# Patient Record
Sex: Female | Born: 2000 | Race: Black or African American | Hispanic: No | Marital: Married | Smoking: Current every day smoker
Health system: Southern US, Community
[De-identification: ages and names within clinical notes are randomized; demographics above are authoritative.]

## PROBLEM LIST (undated history)

## (undated) DIAGNOSIS — F909 Attention-deficit hyperactivity disorder, unspecified type: Secondary | ICD-10-CM

---
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
## 2018-01-10 ENCOUNTER — Emergency Department (HOSPITAL_COMMUNITY)
Admission: EM | Admit: 2018-01-10 | Discharge: 2018-01-10 | Disposition: A | Discharge status: Home / Self Care | Payer: Medicaid Other | Attending: Emergency Medicine | Admitting: Emergency Medicine

## 2018-01-10 DIAGNOSIS — F17210 Nicotine dependence, cigarettes, uncomplicated: Secondary | ICD-10-CM | POA: Diagnosis not present

## 2018-01-10 DIAGNOSIS — Z79899 Other long term (current) drug therapy: Secondary | ICD-10-CM | POA: Diagnosis not present

## 2018-01-10 DIAGNOSIS — N3001 Acute cystitis with hematuria: Secondary | ICD-10-CM | POA: Diagnosis not present

## 2018-01-10 DIAGNOSIS — F909 Attention-deficit hyperactivity disorder, unspecified type: Secondary | ICD-10-CM | POA: Insufficient documentation

## 2018-01-10 DIAGNOSIS — R202 Paresthesia of skin: Secondary | ICD-10-CM | POA: Diagnosis present

## 2018-01-10 LAB — URINALYSIS, ROUTINE W REFLEX MICROSCOPIC
BILIRUBIN URINE: NEGATIVE
Glucose, UA: NEGATIVE mg/dL
Ketones, ur: NEGATIVE mg/dL
Nitrite: NEGATIVE
PH: 5 (ref 5.0–8.0)
Protein, ur: 100 mg/dL — AB
SPECIFIC GRAVITY, URINE: 1.025 (ref 1.005–1.030)
WBC, UA: >50 WBC/hpf — ABNORMAL HIGH (ref 0–5)

## 2018-01-10 LAB — POC URINE PREG, ED: Preg Test, Ur: NEGATIVE

## 2018-01-10 MED ORDER — CEPHALEXIN 500 MG PO CAPS: 500.0000 mg | ORAL_CAPSULE | Freq: Two times a day (BID) | ORAL | 0 refills | Status: DC

## 2018-01-10 NOTE — Discharge Instructions (Addendum)
Please read attached information. If you experience any new or worsening signs or symptoms please return to the emergency room for evaluation. Please follow-up with your primary care provider or specialist as discussed. Please use medication prescribed only as directed and discontinue taking if you have any concerning signs or symptoms.   °

## 2018-01-10 NOTE — ED Notes (Signed)
Pt discharged from ED; instructions provided and scripts given; Pt encouraged to return to ED if symptoms worsen and to f/u with PCP; Pt verbalized understanding of all instructions 

## 2018-01-10 NOTE — ED Triage Notes (Signed)
Pt here with c/o urinary frequency and some slight burning on urination , no bleeding or discharge noted

## 2018-01-10 NOTE — ED Provider Notes (Signed)
[facility] EMERGENCY DEPARTMENT Provider Note   CSN: [id number] Arrival date & time: 01/10/18  0856     History   Chief Complaint Chief Complaint  Patient presents with  . Urinary Tract Infection    HPI Shannon Bruce is a 18 y.o. female.  HPI   18 year old female presents today with urinary complaints.  Patient notes her last 3 days she has had tingling when she urinates in her bladder.  She denies any significant pain, bleeding, change in color or odor.  She denies any nausea vomiting fever back pain or upper abdominal pain.  She notes she is currently on her menstrual cycle, no vaginal discharge noted.  She notes this feels similar to previous urinary tract infection.    Past Medical History:  Diagnosis Date  . ADHD (attention deficit hyperactivity disorder)     Patient Active Problem List   Diagnosis Date Noted  . MDD (major depressive disorder), recurrent, severe, with psychosis (HCC) 01/16/2014  . Attention deficit hyperactivity disorder (ADHD), combined type, moderate 01/16/2014  . ODD (oppositional defiant disorder) 01/16/2014    No past surgical history on file.   OB History   No obstetric history on file.      Home Medications    Prior to Admission medications   Medication Sig Start Date End Date Taking? Authorizing Provider  brompheniramine-pseudoephedrine-DM 30-2-10 MG/5ML syrup Take 5 mLs by mouth 4 (four) times daily as needed. 09/15/17   Evon Slack, PA-C  cephALEXin (KEFLEX) 500 MG capsule Take 1 capsule (500 mg total) by mouth 2 (two) times daily. 01/10/18   Keyron Pokorski, Tinnie Gens, PA-C  ciprofloxacin (CIPRO) 500 MG tablet Take 1 tablet (500 mg total) by mouth every 12 (twelve) hours. 02/18/17   Jimi Schappert, Tinnie Gens, PA-C  famotidine (PEPCID) 20 MG tablet Take 1 tablet (20 mg total) by mouth 2 (two) times daily. For 3 days then as needed thereafter for heartburn 04/08/15   Ree Shay, MD  ibuprofen (ADVIL,MOTRIN) 600 MG tablet Take 1 tablet  (600 mg total) by mouth every 8 (eight) hours as needed for mild pain. 01/21/14   Chauncey Mann, MD  methylphenidate 36 MG PO CR tablet Take 1 tablet (36 mg total) by mouth daily. 01/21/14   Chauncey Mann, MD  naproxen (NAPROSYN) 500 MG tablet Take 1 tablet (500 mg total) by mouth 2 (two) times daily with a meal. 06/02/17   Muthersbaugh, Dahlia Client, PA-C  ondansetron (ZOFRAN ODT) 4 MG disintegrating tablet 4mg  ODT q4 hours prn nausea/vomit 06/02/17   Muthersbaugh, Dahlia Client, PA-C  ondansetron (ZOFRAN) 4 MG tablet Take 1 tablet (4 mg total) by mouth every 8 (eight) hours as needed for nausea or vomiting. 02/20/17   Maczis, Elmer Sow, PA-C  phenazopyridine (PYRIDIUM) 200 MG tablet Take 1 tablet (200 mg total) by mouth 3 (three) times daily. 02/20/17   Maczis, Elmer Sow, PA-C  PRESCRIPTION MEDICATION Inject 1 application into the skin. Birth control implanted in arm summer 2015    [provider]  sulfamethoxazole-trimethoprim (BACTRIM DS,SEPTRA DS) 800-160 MG tablet Take 1 tablet by mouth 2 (two) times daily. 02/20/17   Maczis, Elmer Sow, PA-C  ziprasidone (GEODON) 60 MG capsule Take 1 capsule (60 mg total) by mouth daily at 6 PM. 01/21/14   Chauncey Mann, MD    Family History No family history on file.  Social History Social History   Tobacco Use  . Smoking status: Current Every Day Smoker    Packs/day: 0.25    Types:  Cigarettes  . Smokeless tobacco: Current User  Substance Use Topics  . Alcohol use: No  . Drug use: Yes    Types: Marijuana     Allergies   Patient has no known allergies.   Review of Systems Review of Systems  All other systems reviewed and are negative.    Physical Exam Updated Vital Signs BP 129/75 (BP Location: Right Arm)   Pulse 76   Temp 98.4 F (36.9 C) (Oral)   Resp 20   SpO2 97%   Physical Exam Vitals signs and nursing note reviewed.  Constitutional:      Appearance: She is well-developed.  HENT:     Head: Normocephalic and atraumatic.    Eyes:     General: No scleral icterus.       Right eye: No discharge.        Left eye: No discharge.     Conjunctiva/sclera: Conjunctivae normal.     Pupils: Pupils are equal, round, and reactive to light.  Neck:     Musculoskeletal: Normal range of motion.     Vascular: No JVD.     Trachea: No tracheal deviation.  Pulmonary:     Effort: Pulmonary effort is normal.     Breath sounds: No stridor.  Abdominal:     Comments: Abdomen soft nontender  Neurological:     Mental Status: She is alert and oriented to person, place, and time.     Coordination: Coordination normal.  Psychiatric:        Behavior: Behavior normal.        Thought Content: Thought content normal.        Judgment: Judgment normal.      ED Treatments / Results  Labs (all labs ordered are listed, but only abnormal results are displayed) Labs Reviewed  URINALYSIS, ROUTINE W REFLEX MICROSCOPIC - Abnormal; Notable for the following components:      Result Value   APPearance CLOUDY (*)    Hgb urine dipstick LARGE (*)    Protein, ur 100 (*)    Leukocytes, UA LARGE (*)    RBC / HPF >50 (*)    WBC, UA >50 (*)    Bacteria, UA RARE (*)    All other components within normal limits  POC URINE PREG, ED    EKG None  Radiology No results found.  Procedures Procedures (including critical care time)  Medications Ordered in ED Medications - No data to display   Initial Impression / Assessment and Plan / ED Course  I have reviewed the triage vital signs and the nursing notes.  Pertinent labs & imaging results that were available during my care of the patient were reviewed by me and considered in my medical decision making (see chart for details).     Labs: UA  Imaging:  Consults:  Therapeutics:  Discharge Meds: Keflex  Assessment/Plan: 49 female presents today with uncomplicated urinary tract infection.  She will be treated with Keflex, strict return precautions given, she verbalized understanding  and agreement to today's plan.   Final Clinical Impressions(s) / ED Diagnoses   Final diagnoses:  Acute cystitis with hematuria    ED Discharge Orders         Ordered    cephALEXin (KEFLEX) 500 MG capsule  2 times daily     01/10/18 1035           Eyvonne Mechanic, PA-C 01/10/18 1435    Mancel Bale, MD 01/10/18 [phone]

## 2019-04-25 ENCOUNTER — Emergency Department (HOSPITAL_COMMUNITY)
Admission: EM | Admit: 2019-04-25 | Discharge: 2019-04-25 | Disposition: A | Discharge status: Home / Self Care | Payer: Medicaid Other | Source: Home / Self Care | Attending: Emergency Medicine | Admitting: Emergency Medicine

## 2019-04-25 ENCOUNTER — Encounter (HOSPITAL_COMMUNITY): Payer: Self-pay

## 2019-04-25 ENCOUNTER — Other Ambulatory Visit: Payer: Self-pay

## 2019-04-25 ENCOUNTER — Emergency Department (HOSPITAL_COMMUNITY)
Admission: EM | Admit: 2019-04-25 | Discharge: 2019-04-25 | Disposition: A | Discharge status: Home / Self Care | Payer: Medicaid Other | Attending: Emergency Medicine | Admitting: Emergency Medicine

## 2019-04-25 ENCOUNTER — Emergency Department (HOSPITAL_COMMUNITY): Payer: Medicaid Other

## 2019-04-25 DIAGNOSIS — X58XXXA Exposure to other specified factors, initial encounter: Secondary | ICD-10-CM | POA: Diagnosis not present

## 2019-04-25 DIAGNOSIS — S8392XA Sprain of unspecified site of left knee, initial encounter: Secondary | ICD-10-CM | POA: Insufficient documentation

## 2019-04-25 DIAGNOSIS — Y929 Unspecified place or not applicable: Secondary | ICD-10-CM | POA: Diagnosis not present

## 2019-04-25 DIAGNOSIS — F909 Attention-deficit hyperactivity disorder, unspecified type: Secondary | ICD-10-CM | POA: Diagnosis not present

## 2019-04-25 DIAGNOSIS — Y9339 Activity, other involving climbing, rappelling and jumping off: Secondary | ICD-10-CM | POA: Diagnosis not present

## 2019-04-25 DIAGNOSIS — Y999 Unspecified external cause status: Secondary | ICD-10-CM | POA: Insufficient documentation

## 2019-04-25 DIAGNOSIS — S8992XA Unspecified injury of left lower leg, initial encounter: Secondary | ICD-10-CM | POA: Diagnosis present

## 2019-04-25 DIAGNOSIS — F17210 Nicotine dependence, cigarettes, uncomplicated: Secondary | ICD-10-CM | POA: Insufficient documentation

## 2019-04-25 MED ORDER — NAPROXEN 500 MG PO TABS: 500.0000 mg | ORAL_TABLET | Freq: Two times a day (BID) | ORAL | 0 refills | Status: DC

## 2019-04-25 NOTE — ED Triage Notes (Signed)
Pt reports that she was at work and jumped down and hurt her L knee.

## 2019-04-25 NOTE — Discharge Instructions (Signed)
You are seen today for left knee pain.  Your x-ray was normal.  However, we suspect that you may have a soft tissue injury or meniscus injury of your knee.  Please use the Ace wrap for stability and support.  Use crutches.  Avoid positions and activities that place excessive pressure on the knee joint until pain and swelling resolve. Such activities include: squatting, kneeling, twisting and pivoting, repetitive bending (eg, stairs, getting out of a seated position, clutch and pedal pushing), jogging, dancing, and swimming using the frog or whip kick.  Apply ice to the knee for 15 minutes every four to six hours, while keeping the leg elevated.

## 2019-04-25 NOTE — ED Provider Notes (Signed)
[facility] EMERGENCY DEPARTMENT Provider Note   CSN: [id number] Arrival date & time: 04/25/19  1335     History Chief Complaint  Patient presents with  . Leg Pain    Shannon Bruce is a 19 y.o. female.  Patient is a 19 year old female with past medical history of ADHD presenting to the emergency department for left knee pain.  Patient reports that yesterday she was standing at work on a surface that was about 1 foot high.  She jumped off the surface and landed correctly.  However, about 30 seconds later she began to feel a lateral left knee pain.  She reports clicking in the knee and feeling unstable in the knee to the point where she feels like she is going to fall when she walks.  No treatment prior to arrival.  Reports in the past she has had some issues with this knee as well but has never seen an orthopedic specialist before.        Past Medical History:  Diagnosis Date  . ADHD (attention deficit hyperactivity disorder)     Patient Active Problem List   Diagnosis Date Noted  . MDD (major depressive disorder), recurrent, severe, with psychosis ([place]) 01/16/2014  . Attention deficit hyperactivity disorder (ADHD), combined type, moderate 01/16/2014  . ODD (oppositional defiant disorder) 01/16/2014    History reviewed. No pertinent surgical history.   OB History   No obstetric history on file.     No family history on file.  Social History   Tobacco Use  . Smoking status: Current Every Day Smoker    Packs/day: 0.25    Types: Cigarettes  . Smokeless tobacco: Current User  Substance Use Topics  . Alcohol use: No  . Drug use: Yes    Types: Marijuana    Home Medications Prior to Admission medications   Medication Sig Start Date End Date Taking? Authorizing Provider  Naproxen Sodium (ALEVE PO) Take 1 tablet by mouth daily as needed (for pain).   Yes [provider]  brompheniramine-pseudoephedrine-DM 30-2-10 MG/5ML syrup Take 5 mLs by  mouth 4 (four) times daily as needed. Patient not taking: Reported on 04/25/2019 09/15/17   Duanne Guess, PA-C  cephALEXin (KEFLEX) 500 MG capsule Take 1 capsule (500 mg total) by mouth 2 (two) times daily. Patient not taking: Reported on 04/25/2019 01/10/18   Hedges, Dellis Filbert, PA-C  ciprofloxacin (CIPRO) 500 MG tablet Take 1 tablet (500 mg total) by mouth every 12 (twelve) hours. Patient not taking: Reported on 04/25/2019 02/18/17   Hedges, Dellis Filbert, PA-C  famotidine (PEPCID) 20 MG tablet Take 1 tablet (20 mg total) by mouth 2 (two) times daily. For 3 days then as needed thereafter for heartburn Patient not taking: Reported on 04/25/2019 04/08/15   Harlene Salts, MD  methylphenidate 36 MG PO CR tablet Take 1 tablet (36 mg total) by mouth daily. Patient not taking: Reported on 04/25/2019 01/21/14   Delight Hoh, MD  naproxen (NAPROSYN) 500 MG tablet Take 1 tablet (500 mg total) by mouth 2 (two) times daily with a meal. 04/25/19   Madilyn Hook A, PA-C  ondansetron (ZOFRAN ODT) 4 MG disintegrating tablet 4mg  ODT q4 hours prn nausea/vomit Patient not taking: Reported on 04/25/2019 06/02/17   Muthersbaugh, Jarrett Soho, PA-C  ondansetron (ZOFRAN) 4 MG tablet Take 1 tablet (4 mg total) by mouth every 8 (eight) hours as needed for nausea or vomiting. Patient not taking: Reported on 04/25/2019 02/20/17   Jillyn Ledger, PA-C  phenazopyridine (PYRIDIUM)  200 MG tablet Take 1 tablet (200 mg total) by mouth 3 (three) times daily. Patient not taking: Reported on 04/25/2019 02/20/17   Jacinto Halim, PA-C  sulfamethoxazole-trimethoprim (BACTRIM DS,SEPTRA DS) 800-160 MG tablet Take 1 tablet by mouth 2 (two) times daily. Patient not taking: Reported on 04/25/2019 02/20/17   Jacinto Halim, PA-C  ziprasidone (GEODON) 60 MG capsule Take 1 capsule (60 mg total) by mouth daily at 6 PM. Patient not taking: Reported on 04/25/2019 01/21/14   Chauncey Mann, MD    Allergies    Patient has no known allergies.  Review of  Systems   Review of Systems  Musculoskeletal: Positive for arthralgias and gait problem. Negative for back pain, joint swelling, myalgias, neck pain and neck stiffness.  Skin: Negative for pallor, rash and wound.  Neurological: Negative for numbness.    Physical Exam Updated Vital Signs BP 111/70 (BP Location: Left Arm)   Pulse 67   Temp 98.5 F (36.9 C) (Oral)   Resp 18   Ht 5\' 3"  (1.6 m)   Wt 91.2 kg   SpO2 100%   BMI 35.61 kg/m   Physical Exam Vitals and nursing note reviewed.  Constitutional:      Appearance: Normal appearance.  HENT:     Head: Normocephalic.  Eyes:     Conjunctiva/sclera: Conjunctivae normal.  Pulmonary:     Effort: Pulmonary effort is normal.  Musculoskeletal:     Left knee: No swelling, deformity, effusion, erythema, ecchymosis, lacerations or bony tenderness. Normal range of motion. Tenderness (Lateral joint line tenderness) present. No patellar tendon tenderness.     Instability Tests: Positive lateral McMurray test.  Skin:    General: Skin is dry.     Capillary Refill: Capillary refill takes less than 2 seconds.  Neurological:     Mental Status: She is alert.     Sensory: No sensory deficit.  Psychiatric:        Mood and Affect: Mood normal.     ED Results / Procedures / Treatments   Labs (all labs ordered are listed, but only abnormal results are displayed) Labs Reviewed - No data to display  EKG None  Radiology DG Knee Complete 4 Views Left  Result Date: 04/25/2019 CLINICAL DATA:  Left knee injury, pain EXAM: LEFT KNEE - COMPLETE 4+ VIEW COMPARISON:  06/01/2017 FINDINGS: Frontal, bilateral oblique, lateral views of the left knee are obtained. No fracture, subluxation, or dislocation. Joint spaces are well preserved. No joint effusion. Soft tissues are unremarkable. IMPRESSION: 1. Stable unremarkable left knee. Electronically Signed   By: 06/03/2017 M.D.   On: 04/25/2019 02:53    Procedures Procedures (including critical care  time)  Medications Ordered in ED Medications - No data to display  ED Course  I have reviewed the triage vital signs and the nursing notes.  Pertinent labs & imaging results that were available during my care of the patient were reviewed by me and considered in my medical decision making (see chart for details).  Clinical Course as of Apr 24 1628  Thu Apr 25, 2019  1606 Patient with lateral knee pain after jumping off of it.  History of the same in the past.  On my exam her knee appears normal.  She does have some lateral knee joint tenderness.  She had a positive click with a lateral McMurray test.  However, there is not significant instability, pain or swelling.  We will place her in an Ace wrap, crutches, NSAIDs and  sit down only duty at work.  Advised to follow-up with orthopedics in 1 to 2 weeks.   [KM]    Clinical Course User Index [KM] Jeral Pinch   MDM Rules/Calculators/A&P                      Based on review of vitals, medical screening exam, lab work and/or imaging, there does not appear to be an acute, emergent etiology for the patient's symptoms. Counseled pt on good return precautions and encouraged both PCP and ED follow-up as needed.  Prior to discharge, I also discussed incidental imaging findings with patient in detail and advised appropriate, recommended follow-up in detail.  Clinical Impression: 1. Sprain of left knee, unspecified ligament, initial encounter     Disposition: Discharge  Prior to providing a prescription for a controlled substance, I independently reviewed the patient's recent prescription history on the [place] Controlled Substance Reporting System. The patient had no recent or regular prescriptions and was deemed appropriate for a brief, less than 3 day prescription of narcotic for acute analgesia.  This note was prepared with assistance of Conservation officer, historic buildings. Occasional wrong-word or sound-a-like substitutions may  have occurred due to the inherent limitations of voice recognition software.  Final Clinical Impression(s) / ED Diagnoses Final diagnoses:  Sprain of left knee, unspecified ligament, initial encounter    Rx / DC Orders ED Discharge Orders         Ordered    naproxen (NAPROSYN) 500 MG tablet  2 times daily with meals     04/25/19 1630           Jeral Pinch 04/25/19 1630    Terrilee Files, MD 04/26/19 1031

## 2019-04-25 NOTE — ED Notes (Signed)
Patient verbalizes understanding of discharge instructions. Opportunity for questioning and answers were provided. Armband removed by staff, pt discharged from ED.  

## 2019-06-08 IMAGING — DX DG KNEE COMPLETE 4+V*L*
4 series · 4 of 4 positions shown · non-contrast
Comparison: 06/26/2012

CLINICAL DATA: Left knee pain for 2 years

EXAM:
LEFT KNEE - COMPLETE 4+ VIEW

[knee ap]
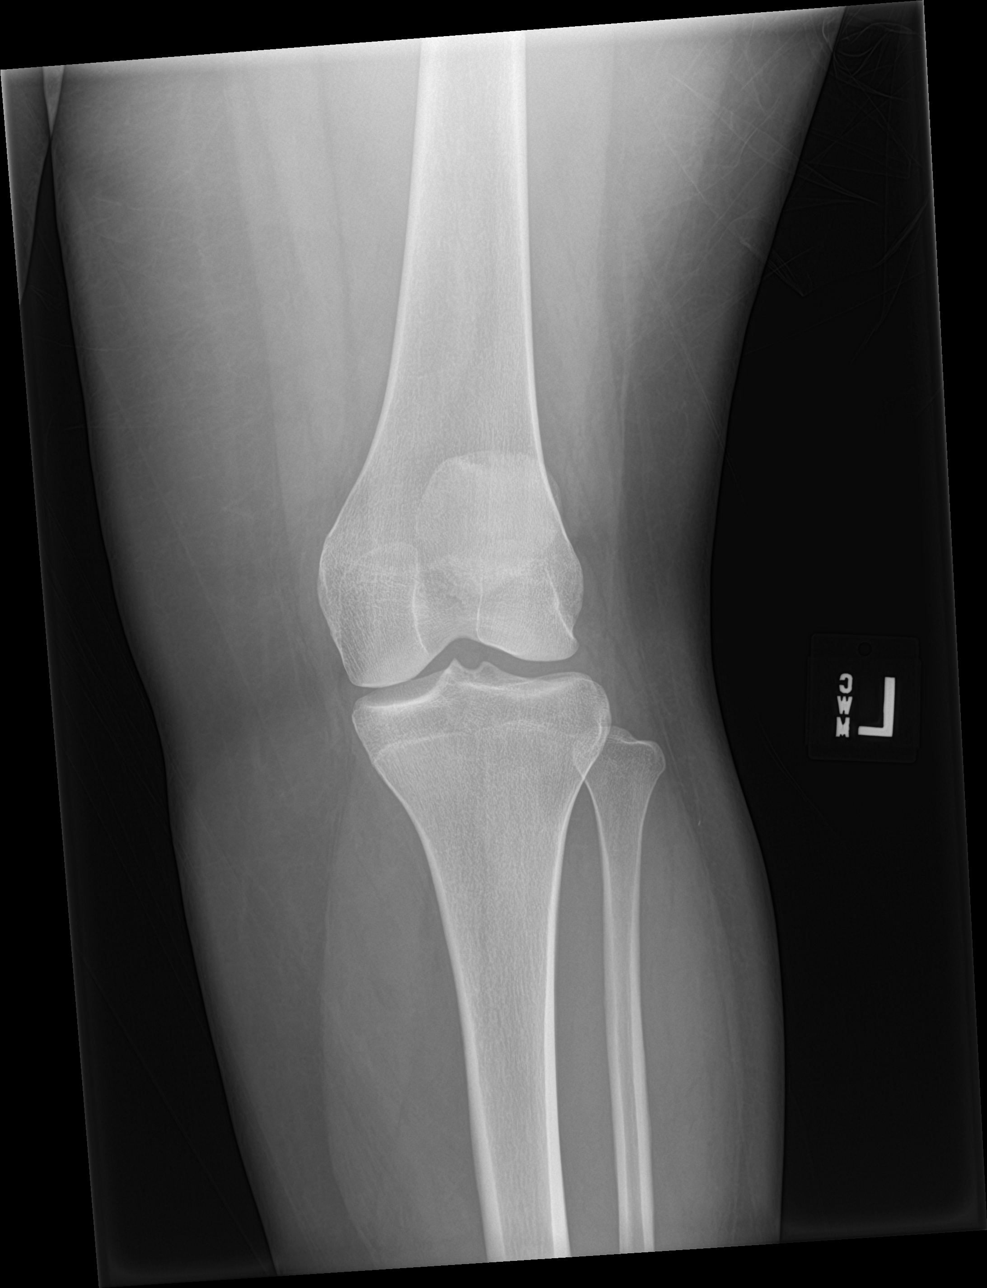

[knee lat]
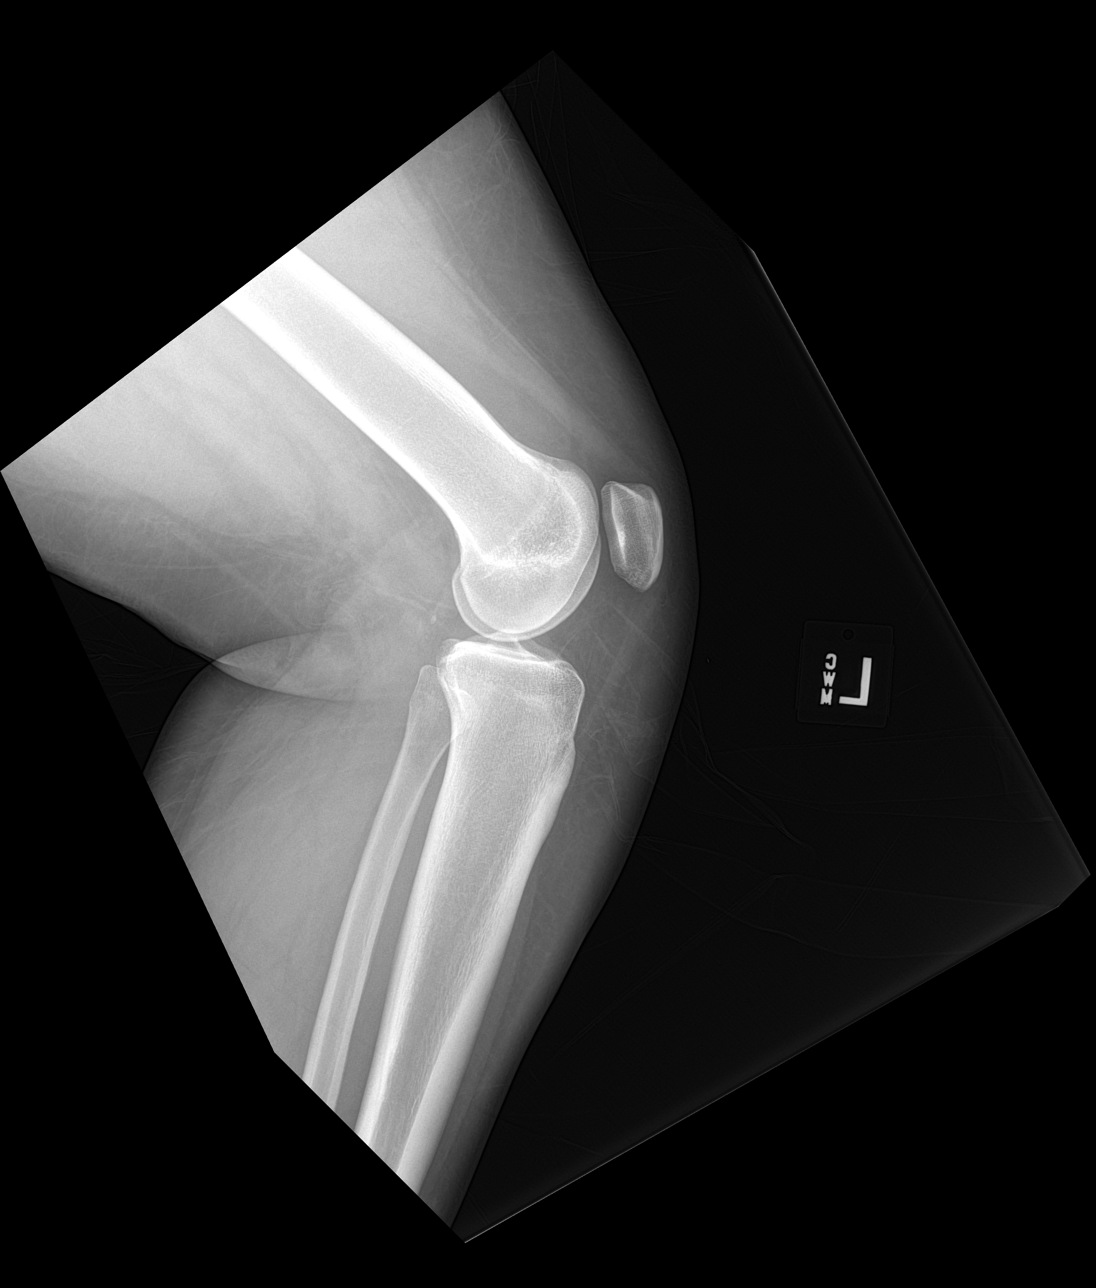

[knee obl (1 of 2)]
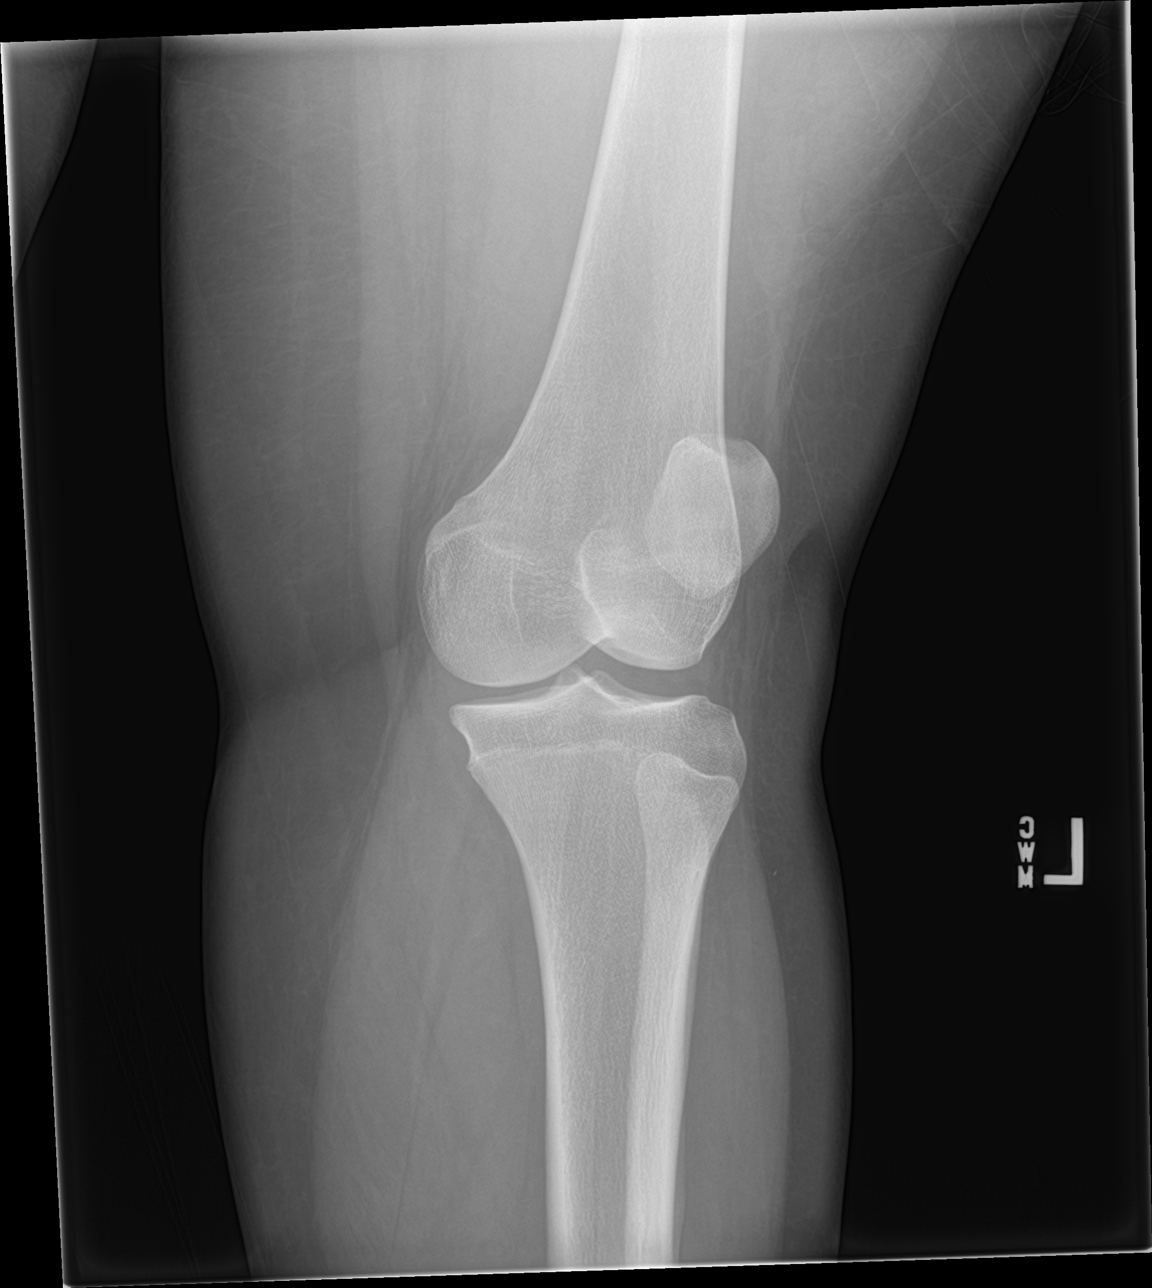

[knee obl (2 of 2)]
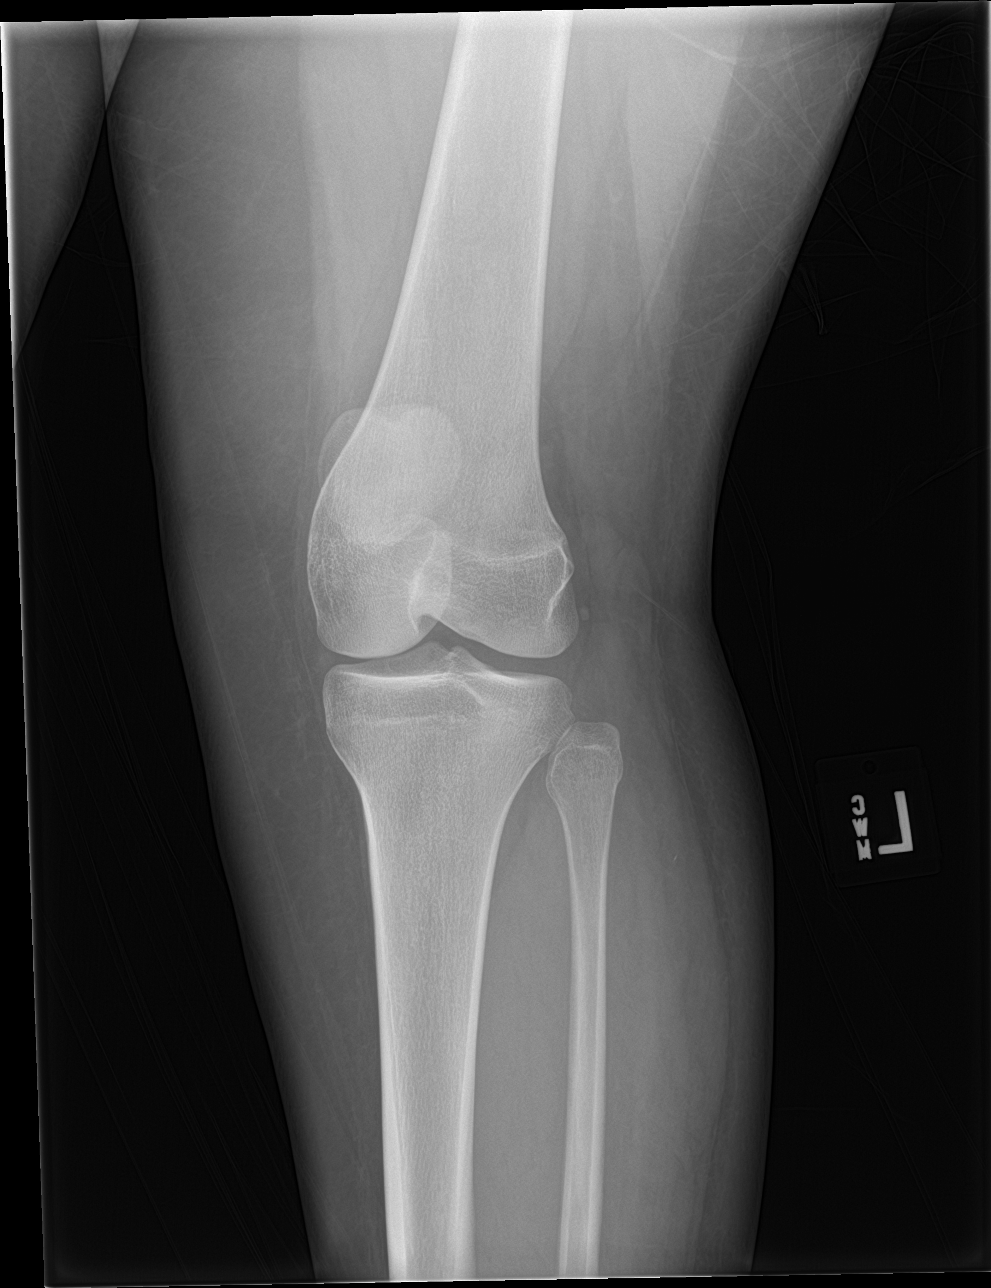

[4 of 4 positions shown; findings below may reference images not displayed]

FINDINGS: No evidence of fracture, dislocation, or joint effusion. No evidence
of arthropathy or other focal bone abnormality. Soft tissues are
unremarkable.
IMPRESSION: No acute fracture, malalignment, suspicious osseous lesion or
effusion.

## 2019-10-31 ENCOUNTER — Other Ambulatory Visit: Payer: Self-pay

## 2019-10-31 ENCOUNTER — Inpatient Hospital Stay (HOSPITAL_COMMUNITY)
Admission: AD | Admit: 2019-10-31 | Discharge: 2019-10-31 | Disposition: A | Discharge status: Home / Self Care | Payer: Medicaid Other | Attending: Obstetrics & Gynecology | Admitting: Obstetrics & Gynecology

## 2019-10-31 DIAGNOSIS — Z3202 Encounter for pregnancy test, result negative: Secondary | ICD-10-CM | POA: Diagnosis not present

## 2019-10-31 NOTE — MAU Note (Signed)
Presents with c/o VB and pregnancy confirmation.  Reports VB began yesterday, started as spotting but has gotten heavier.  States passing blood clots. Unsure of LMP.  States had - UPT @ home, did salt pregnancy test and test +.

## 2019-10-31 NOTE — MAU Provider Note (Signed)
First Provider Initiated Contact with Patient 10/31/19 1158      S Ms. Shannon Bruce is a 19 y.o. non-pregnant patient who presents to MAU today with chief complaint of vaginal bleeding in the setting of possible pregnancy. She reports negative Clear Blue pregnancy test but reports positive "salt test" she discovered on social media.   LMP on her birthday 10/02/2021.  O BP (!) 142/85 (BP Location: Right Arm)   Pulse 61   Temp 98.5 F (36.9 C) (Oral)   Resp 20   Ht 5\' 4"  (1.626 m)   Wt 87 kg   SpO2 100%   BMI 32.94 kg/m  Physical Exam Vitals and nursing note reviewed. Exam conducted with a chaperone present.  Constitutional:      General: She is not in acute distress.    Appearance: She is obese. She is not ill-appearing or toxic-appearing.  Cardiovascular:     Rate and Rhythm: Normal rate.     Pulses: Normal pulses.  Pulmonary:     Effort: Pulmonary effort is normal.  Skin:    General: Skin is warm and dry.  Neurological:     General: No focal deficit present.     Mental Status: She is alert.  Psychiatric:        Mood and Affect: Mood normal.        Behavior: Behavior normal.        Thought Content: Thought content normal.        Judgment: Judgment normal.    A Medical screening exam complete Negative pregnancy test  P Discharge from MAU in stable condition Warning signs for worsening condition that would warrant emergency follow-up discussed Patient may return to MAU as needed for OB/GYN emergencies  , CNM 10/31/2019 1:36 PM

## 2019-10-31 NOTE — Discharge Instructions (Signed)
Home Pregnancy Test Information ° °A home pregnancy test helps you determine whether you are pregnant or not. There are several types of home pregnancy tests that can be bought at a grocery store or pharmacy. °What is being tested? °A home pregnancy test detects the presence of a hormone in your urine. The hormone is produced by cells of the placenta (human chorionic gonadotropin, or hCG). The placenta is the organ that forms to nourish and support a developing baby. °How are pregnancy tests done? °Home pregnancy tests require a urine sample. °· Most kits use a plastic testing device with a strip of paper that indicates whether there is hCG in your urine. °· Follow the test package instructions very carefully for how to test your urine. Depending on the test, you may need to: °? Urinate directly onto the stick. °? Urinate into a cup. °· Wait for the results as directed by the package instructions. The amount of time may be different for each type of test. °· Follow the test package instructions for how to read your test results. Depending on the test, results may be displayed as: °? A plus or a minus sign. °? One or two lines. °? "Pregnant" or "not pregnant." °· For best results, use your first urine of the morning. That is when the concentration of hCG is highest. °How accurate are home pregnancy tests? °Home pregnancy tests are very accurate when: °· You are at least 3-[redacted] weeks pregnant. °· It has been 1-2 weeks since your missed period. °· You use the test according to the package instructions. °What can interfere with home pregnancy test results? °Sometimes, a home pregnancy test may report that you are pregnant when you are not pregnant (false-positive result). This can happen if you: °· Are taking certain medicines, such as: °? Medicine to control seizures. °? Anti-anxiety medicine. °? Fertility medicine with hCG. °· Have a medical condition that affects your hormone levels. °· Had a recent pregnancy loss  (miscarriage) or abortion. °Sometimes, a home pregnancy test may report that you are not pregnant when you are pregnant (false-negative result). This can happen if you: °· Took the test too early in your pregnancy. Before 3-4 weeks of pregnancy, there may not be enough hCG to detect. °· Drank a lot of liquid before the test. °· Used an expired pregnancy test. °· Are taking certain medicines, such as antihistamines or water pills (diuretics). °What should I do if I have a positive pregnancy test? °If you have a positive home pregnancy test, schedule an appointment with your health care provider. You might need additional testing to confirm the pregnancy. °What should I do if I have a negative pregnancy test? °If you have a negative home pregnancy test but still have symptoms of pregnancy, contact your health care provider. Your health care provider will test a sample of your blood to check for pregnancy. In some cases, a blood test will return a positive result even if a urine test was negative because blood tests are more sensitive. This means blood tests can detect hCG earlier than home pregnancy tests. °Follow these instructions at home: °If you are pregnant, planning to become pregnant, or think you may be pregnant: °· Do not drink alcohol. °· Do not use street drugs. °· Do not use any products that contain nicotine or tobacco, such as cigarettes and e-cigarettes. If you need help quitting, ask your health care provider. °· Take a prenatal vitamin that contains at least 400 mcg of folic   acid daily. °Summary °· A home pregnancy test helps you determine whether you are pregnant or not by detecting the presence of the hormone human chorionic gonadotropin (hCG) in a sample of your urine. °· Follow the test package instructions very carefully. For best results, use your first urine of the morning. That is when the concentration of hCG is highest. °· Home pregnancy tests are very accurate when you are 3-[redacted] weeks  pregnant or when it has been 1-2 weeks since your missed period. °· A home pregnancy test may report that you are pregnant when you are not pregnant or that you are not pregnant when you are pregnant. °· Contact your health care provider to confirm your results. Your health care provider will test a sample of your blood to check for pregnancy. °This information is not intended to replace advice given to you by your health care provider. Make sure you discuss any questions you have with your health care provider. °Document Revised: 04/19/2018 Document Reviewed: 01/09/2017 °Elsevier Patient Education © 2020 Elsevier Inc. ° °

## 2020-01-08 ENCOUNTER — Other Ambulatory Visit: Payer: Self-pay

## 2020-01-08 ENCOUNTER — Encounter (HOSPITAL_COMMUNITY): Payer: Self-pay | Admitting: Emergency Medicine

## 2020-01-08 ENCOUNTER — Emergency Department (HOSPITAL_COMMUNITY)
Admission: EM | Admit: 2020-01-08 | Discharge: 2020-01-08 | Disposition: A | Discharge status: Home / Self Care | Payer: Medicaid Other | Attending: Emergency Medicine | Admitting: Emergency Medicine

## 2020-01-08 DIAGNOSIS — N939 Abnormal uterine and vaginal bleeding, unspecified: Secondary | ICD-10-CM | POA: Insufficient documentation

## 2020-01-08 DIAGNOSIS — R1084 Generalized abdominal pain: Secondary | ICD-10-CM | POA: Diagnosis not present

## 2020-01-08 DIAGNOSIS — Z5321 Procedure and treatment not carried out due to patient leaving prior to being seen by health care provider: Secondary | ICD-10-CM | POA: Diagnosis not present

## 2020-01-08 LAB — CBC
HCT: 41.5 % (ref 36.0–46.0)
Hemoglobin: 13 g/dL (ref 12.0–15.0)
MCH: 29.2 pg (ref 26.0–34.0)
MCHC: 31.3 g/dL (ref 30.0–36.0)
MCV: 93.3 fL (ref 80.0–100.0)
Platelets: 248 10*3/uL (ref 150–400)
RBC: 4.45 MIL/uL (ref 3.87–5.11)
RDW: 13.1 % (ref 11.5–15.5)
WBC: 7.8 10*3/uL (ref 4.0–10.5)
nRBC: 0 % (ref 0.0–0.2)

## 2020-01-08 LAB — I-STAT BETA HCG BLOOD, ED (MC, WL, AP ONLY): I-stat hCG, quantitative: <5 m[IU]/mL (ref <5)

## 2020-01-08 LAB — COMPREHENSIVE METABOLIC PANEL
ALT: 15 U/L (ref 0–44)
AST: 14 U/L — ABNORMAL LOW (ref 15–41)
Albumin: 3.7 g/dL (ref 3.5–5.0)
Alkaline Phosphatase: 42 U/L (ref 38–126)
Anion gap: 9 (ref 5–15)
BUN: 18 mg/dL (ref 6–20)
CO2: 21 mmol/L — ABNORMAL LOW (ref 22–32)
Calcium: 8.7 mg/dL — ABNORMAL LOW (ref 8.9–10.3)
Chloride: 109 mmol/L (ref 98–111)
Creatinine, Ser: 0.75 mg/dL (ref 0.44–1.00)
GFR, Estimated: >60 mL/min (ref >60)
Glucose, Bld: 89 mg/dL (ref 70–99)
Potassium: 3.9 mmol/L (ref 3.5–5.1)
Sodium: 139 mmol/L (ref 135–145)
Total Bilirubin: 0.5 mg/dL (ref 0.3–1.2)
Total Protein: 6.9 g/dL (ref 6.5–8.1)

## 2020-01-08 LAB — LIPASE, BLOOD: Lipase: 23 U/L (ref 11–51)

## 2020-01-08 NOTE — ED Notes (Signed)
Pt called for vitals x3 with no response.  

## 2020-01-08 NOTE — ED Notes (Signed)
Pt called for vitals with no response x1 

## 2020-01-08 NOTE — ED Notes (Signed)
Pt called for vitals x2 with no response 

## 2020-01-08 NOTE — ED Triage Notes (Signed)
Pt arrives to ED with c/o abdominal cramping since last night. Pt states that she started spotting last night before the cramping started. Heavy vaginal bleeding this morning. Cramping is generalized. No fevers, chills, N/V, or diarrhea.

## 2020-05-12 ENCOUNTER — Inpatient Hospital Stay (HOSPITAL_COMMUNITY)
Admission: AD | Admit: 2020-05-12 | Discharge: 2020-05-12 | Disposition: A | Discharge status: Home / Self Care | Payer: Medicaid Other | Attending: Obstetrics & Gynecology | Admitting: Obstetrics & Gynecology

## 2020-05-12 ENCOUNTER — Other Ambulatory Visit: Payer: Self-pay

## 2020-05-12 DIAGNOSIS — Z789 Other specified health status: Secondary | ICD-10-CM | POA: Diagnosis present

## 2020-05-12 NOTE — MAU Provider Note (Signed)
Event Date/Time   First Provider Initiated Contact with Patient 05/12/20 1207      S Ms. Shannon Bruce is a 20 y.o. No obstetric history on file. patient who presents to MAU today requesting pregnancy confirmation. Patient endorses two negative HPTs, states she started her period this morning  O BP 131/68 (BP Location: Right Arm)   Pulse 91   Temp 98.4 F (36.9 C) (Oral)   Resp 16   Ht 5\' 4"  (1.626 m)   Wt 88.3 kg   LMP 04/17/2020   SpO2 99% Comment: room air  BMI 33.42 kg/m    Physical Exam Vitals and nursing note reviewed.  Cardiovascular:     Rate and Rhythm: Normal rate.     Pulses: Normal pulses.  Pulmonary:     Effort: Pulmonary effort is normal.  Skin:    Capillary Refill: Capillary refill takes less than 2 seconds.  Neurological:     Mental Status: She is oriented to person, place, and time.  Psychiatric:        Mood and Affect: Mood normal.        Behavior: Behavior normal.        Thought Content: Thought content normal.        Judgment: Judgment normal.     A Medical screening exam complete Negative home pregnancy tests Patient reports starting her period this morning  P Discharge from MAU in stable condition List of options for follow-up given including walk-in pregnancy confirmation at [facility] offices Warning signs for worsening condition that would warrant emergency follow-up discussed Patient may return to MAU as needed   [facility], Calvert Cantor 05/12/2020 1:36 PM

## 2020-05-12 NOTE — MAU Note (Signed)
Shannon Bruce is a 20 y.o. here in MAU reporting: states she ovulated 6 days after her last period and then had unprotected sex several days later. About a week after that started having pregnancy symptoms but states 2 negative pregnancy tests. Would like a blood pregnancy test. States occasionally has a pulling abdominal pain but none in the past 2 days. Denies bleeding or discharge. Pt now reporting she started her period this morning.   LMP: 04/17/20  Onset of complaint: ongoing  Pain score: 0/10  Vitals:   05/12/20 1205  BP: 131/68  Pulse: 91  Resp: 16  Temp: 98.4 F (36.9 C)  SpO2: 99%     Lab orders placed from triage: none

## 2020-05-12 NOTE — Discharge Instructions (Signed)
Home Pregnancy Test Information  Why am I having this test? A home pregnancy test helps you determine whether you are pregnant or not. There are several types of home pregnancy tests that can be bought at a store or pharmacy. Home pregnancy tests are very accurate when:  You are at least 3-[redacted] weeks pregnant.  It has been 1-2 weeks since your missed period.  You use the test according to the package instructions. What is being tested? A home pregnancy test detects the presence of a hormone called human chorionic gonadotropin (hCG) in your urine. HCG is produced by cells of the placenta. The placenta is the organ that forms to nourish and support a developing baby. What kind of sample is taken? Home pregnancy tests require a urine sample. How do I collect samples at home? Most kits use a plastic testing device with a strip of paper that indicates whether there is hCG in your urine. Follow the test package instructions very carefully for how to test your urine. Depending on the test, you may need to:  Urinate directly onto the stick.  Urinate into a cup. Wait for the results as directed by the package instructions. The amount of time may be different for each type of test. How do I prepare for this test? For best results, collect the sample the first time you urinate in the morning. This when the concentration of hCG is highest. How are the results reported? Follow the test package instructions for how to read your test results. Depending on the test, results may be displayed as:  A plus or a minus sign.  One or two lines.  "Pregnant" or "not pregnant." What do the results mean? Positive pregnancy test result A positive home pregnancy test means that you are pregnant. It is important to schedule an appointment with your health care provider to start prenatal care. Your health care provider may perform additional testing to confirm the pregnancy and to determine that you have a normal  pregnancy. Negative pregnancy test result If you have a negative home pregnancy test you may want to wait a few days and then repeat the home pregnancy test. Many kits contain a second test as a back-up. If you have a negative test and also have symptoms of pregnancy, contact your health care provider. Your health care provider can test a sample of your blood to check for pregnancy. A blood test may return a positive result even if a urine test was negative because blood tests are more accurate. This means blood tests can detect hCG earlier than urine tests. Talk with your health care provider about what your results mean. Some things to know about a home pregnancy test Sometimes, a home pregnancy test may report that you are pregnant when you are not pregnant (false-positive result). This can happen if you:  Are taking certain medicines, such as: ? Medicine to control seizures. ? Anti-anxiety medicine. ? Fertility medicine with hCG.  Have a medical condition that affects your hormone levels.  Had a recent pregnancy loss (miscarriage) or abortion. Sometimes, a home pregnancy test may report that you are not pregnant when you are pregnant (false-negative result). This can happen if you:  Take the test too early in your pregnancy. Before 3-4 weeks of pregnancy, there may not be enough hCG to detect.  Drink a lot of liquid before the test.  Use an expired pregnancy test.  Are taking certain medicines, such as antihistamines or water pills (diuretics). Questions to  ask your health care provider Ask your health care provider, or the department that is doing the test:  When will my results be ready?  How will I get my results?  What are my treatment options?  What other tests do I need?  What are my next steps? Summary  A home pregnancy test helps you determine whether you are pregnant or not by detecting the presence of the hormone human chorionic gonadotropin (hCG) in a sample of  your urine.  Follow the test package instructions very carefully. For best results, collect the sample the first time you urinate in the morning. This when the concentration of hCG is highest.  Home pregnancy tests are very accurate when you are 3-[redacted] weeks pregnant or when it has been 1-2 weeks since your missed period.  A positive home pregnancy test means that you are pregnant. It is important to schedule an appointment with your health care provider to start prenatal care. This information is not intended to replace advice given to you by your health care provider. Make sure you discuss any questions you have with your health care provider. Document Revised: 09/30/2019 Document Reviewed: 09/30/2019 Elsevier Patient Education  2021 Elsevier Inc.    

## 2020-11-21 ENCOUNTER — Other Ambulatory Visit: Payer: Self-pay

## 2020-11-21 ENCOUNTER — Inpatient Hospital Stay (HOSPITAL_COMMUNITY)
Admission: AD | Admit: 2020-11-21 | Discharge: 2020-11-21 | Disposition: A | Discharge status: Home / Self Care | Payer: Medicaid Other | Attending: Obstetrics and Gynecology | Admitting: Obstetrics and Gynecology

## 2020-11-21 DIAGNOSIS — Z3202 Encounter for pregnancy test, result negative: Secondary | ICD-10-CM | POA: Insufficient documentation

## 2020-11-21 DIAGNOSIS — Z789 Other specified health status: Secondary | ICD-10-CM

## 2020-11-21 DIAGNOSIS — N926 Irregular menstruation, unspecified: Secondary | ICD-10-CM

## 2020-11-21 LAB — POCT PREGNANCY, URINE: Preg Test, Ur: NEGATIVE

## 2020-11-21 NOTE — MAU Note (Signed)
Shannon Bruce is a 20 y.o. here in MAU reporting: states she had her period last week and is trying to get pregnant. Bleeding started again on Monday. Also having some cramping. Has not done a pregnancy test at home.   LMP: unknown  Onset of complaint: since Monday  Pain score: 10/10  Vitals:   11/21/20 1149  BP: 118/66  Pulse: 78  Resp: 16  Temp: 98 F (36.7 C)  SpO2: 99%     Lab orders placed from triage: upt

## 2020-11-21 NOTE — MAU Provider Note (Signed)
Event Date/Time   First Provider Initiated Contact with Patient 11/21/20 1204     S Ms. Finesse Fielder is a 20 y.o. nulliparous female who presents to MAU today with complaint of irregular vaginal bleeding and desired pregnancy. She got her period last week and then began bleeding again on Monday. Has not taken a pregnancy test at home yet.  O BP 118/66 (BP Location: Right Arm)   Pulse 78   Temp 98 F (36.7 C) (Oral)   Resp 16   Ht 5\' 4"  (1.626 m)   Wt 195 lb 9.6 oz (88.7 kg)   SpO2 99% Comment: room air  BMI 33.57 kg/m  Constitutional: Well-developed, well-nourished pregnant female in no acute distress.  HEENT: PERRLA Skin: normal color and turgor Cardiovascular: normal rate & rhythm Respiratory: normal effort GI: Abd soft, non-tender MS: Extremities nontender, no edema, normal ROM Neurologic: Alert and oriented x 4.  GU: no CVA tenderness Pelvic: exam deferred  HCG negative, pt informed of results and advised to seek regular gynecological care.  A Not currently pregnant Medical screening exam complete  P Discharge from MAU in stable condition Warning signs for worsening condition that would warrant emergency follow-up discussed Patient may return to MAU as needed for emergent OB related complaints  , CNM 11/21/2020 12:06 PM

## 2020-12-29 ENCOUNTER — Other Ambulatory Visit: Payer: Self-pay

## 2020-12-29 ENCOUNTER — Inpatient Hospital Stay (HOSPITAL_COMMUNITY)
Admission: AD | Admit: 2020-12-29 | Discharge: 2020-12-29 | Disposition: A | Discharge status: Home / Self Care | Payer: Medicaid Other | Attending: Obstetrics and Gynecology | Admitting: Obstetrics and Gynecology

## 2020-12-29 DIAGNOSIS — Z319 Encounter for procreative management, unspecified: Secondary | ICD-10-CM | POA: Diagnosis not present

## 2020-12-29 DIAGNOSIS — Z3202 Encounter for pregnancy test, result negative: Secondary | ICD-10-CM | POA: Insufficient documentation

## 2020-12-29 LAB — POCT PREGNANCY, URINE: Preg Test, Ur: NEGATIVE

## 2020-12-29 LAB — HCG, QUANTITATIVE, PREGNANCY: hCG, Beta Chain, Quant, S: <1 m[IU]/mL (ref <5)

## 2020-12-29 NOTE — MAU Note (Signed)
Presents stating she she 8 days late from getting cycle.  States has pregnancy symptoms.  -HPT.

## 2020-12-29 NOTE — MAU Provider Note (Signed)
Event Date/Time   First Provider Initiated Contact with Patient 12/29/20 1514      S Ms. Shannon Bruce is a 20 y.o. patient who presents to MAU today with complaint of missed period. She states she is 8 days late and having pregnancy symptoms. She had a negative pregnancy test at home but is requesting a blood test because she "doesn't believed urine tests work."   O BP 131/63 (BP Location: Right Arm)    Pulse (!) 58    Temp 98.2 F (36.8 C) (Oral)    Resp 19    Ht 5\' 3"  (1.6 m)    Wt 93.3 kg    LMP 11/23/2020    SpO2 100%    BMI 36.42 kg/m  Physical Exam Vitals and nursing note reviewed.  Constitutional:      General: She is not in acute distress.    Appearance: She is well-developed.  HENT:     Head: Normocephalic.  Eyes:     Pupils: Pupils are equal, round, and reactive to light.  Cardiovascular:     Rate and Rhythm: Normal rate and regular rhythm.     Heart sounds: Normal heart sounds.  Pulmonary:     Effort: Pulmonary effort is normal. No respiratory distress.     Breath sounds: Normal breath sounds.  Abdominal:     General: Bowel sounds are normal. There is no distension.     Palpations: Abdomen is soft.     Tenderness: There is no abdominal tenderness.  Skin:    General: Skin is warm and dry.  Neurological:     Mental Status: She is alert and oriented to person, place, and time.  Psychiatric:        Mood and Affect: Mood normal.        Behavior: Behavior normal.        Thought Content: Thought content normal.        Judgment: Judgment normal.    A Medical screening exam complete 1. Negative pregnancy test   2. Patient desires pregnancy    Results for orders placed or performed during the hospital encounter of 12/29/20 (from the past 24 hour(s))  Pregnancy, urine POC     Status: None   Collection Time: 12/29/20  3:10 PM  Result Value Ref Range   Preg Test, Ur NEGATIVE NEGATIVE  hCG, quantitative, pregnancy     Status: None   Collection Time: 12/29/20  3:20 PM   Result Value Ref Range   hCG, Beta Chain, Quant, S <1 <5 mIU/mL     P Discharge from MAU in stable condition Patient given the option of transfer to [facility] for further evaluation or seek care in outpatient facility of choice  List of options for follow-up given  Warning signs for worsening condition that would warrant emergency follow-up discussed Patient may return to MAU as needed   [facility], Rolm Bookbinder 12/29/2020 3:14 PM

## 2020-12-29 NOTE — Progress Notes (Signed)
Ma Hillock, CNM into see and complete MSE.

## 2020-12-29 NOTE — Discharge Instructions (Signed)
Prenatal Care Providers           [facility]  [address] [phone]  [facility]   [address]  [phone]  [facility] [place]       [address] [phone]            [facility] @      [address] [phone]          [facility] @ [place]   [address] [phone]  [facility]  [address] [phone]     [facility]  [address]   [phone]     [facility]  Phone: [phone]  [facility]  Phone: [phone]  [facility] Phone: [phone]  [facility] Phone: [phone]  [facility] Phone: [phone]  [facility] Phone: [phone]  [facility]  Phone: [phone]  

## 2021-01-19 ENCOUNTER — Encounter (HOSPITAL_COMMUNITY): Payer: Self-pay

## 2021-01-19 ENCOUNTER — Emergency Department (HOSPITAL_COMMUNITY)
Admission: EM | Admit: 2021-01-19 | Discharge: 2021-01-20 | Disposition: A | Discharge status: Home / Self Care | Payer: Medicaid Other | Attending: Physician Assistant | Admitting: Physician Assistant

## 2021-01-19 ENCOUNTER — Other Ambulatory Visit: Payer: Self-pay

## 2021-01-19 DIAGNOSIS — R5383 Other fatigue: Secondary | ICD-10-CM | POA: Insufficient documentation

## 2021-01-19 DIAGNOSIS — Z5321 Procedure and treatment not carried out due to patient leaving prior to being seen by health care provider: Secondary | ICD-10-CM | POA: Insufficient documentation

## 2021-01-19 DIAGNOSIS — J029 Acute pharyngitis, unspecified: Secondary | ICD-10-CM | POA: Diagnosis not present

## 2021-01-19 DIAGNOSIS — Z20822 Contact with and (suspected) exposure to covid-19: Secondary | ICD-10-CM | POA: Diagnosis not present

## 2021-01-19 LAB — RESP PANEL BY RT-PCR (FLU A&B, COVID) ARPGX2
Influenza A by PCR: NEGATIVE
Influenza B by PCR: NEGATIVE
SARS Coronavirus 2 by RT PCR: NEGATIVE

## 2021-01-19 LAB — GROUP A STREP BY PCR: Group A Strep by PCR: NOT DETECTED

## 2021-01-19 MED ORDER — ACETAMINOPHEN 325 MG PO TABS
650.0000 mg | ORAL_TABLET | Freq: Once | ORAL | Status: DC
Start: 2021-01-19 — End: 2021-01-20

## 2021-01-19 NOTE — ED Notes (Signed)
Pt did not respond when called for vitals check X3  

## 2021-01-19 NOTE — ED Notes (Signed)
Pt called for vitals, no response. 

## 2021-01-19 NOTE — ED Provider Triage Note (Signed)
Emergency Medicine Provider Triage Evaluation Note  Shannon Bruce , a 21 y.o. female  was evaluated in triage.  Pt complains of sore throat, fatigue.  Review of Systems  Positive: Sore throat, fatigue Negative: cough  Physical Exam  BP 123/82 (BP Location: Right Arm)    Pulse (!) 139    Temp 99.2 F (37.3 C) (Oral)    Resp (!) 22    Ht 5\' 3"  (1.6 m)    Wt 94 kg    SpO2 99%    BMI 36.71 kg/m  Gen:   Awake, no distress   Resp:  Normal effort  MSK:   Moves extremities without difficulty  Other:  Pharyngeal erythema, no unilateral tonsillar swelling  Medical Decision Making  Medically screening exam initiated at 6:53 PM.  Appropriate orders placed.  Shannon Bruce was informed that the remainder of the evaluation will be completed by another provider, this initial triage assessment does not replace that evaluation, and the importance of remaining in the ED until their evaluation is complete.     Marvel Plan, Karrie Meres 01/19/21 1853

## 2021-01-19 NOTE — ED Triage Notes (Signed)
Pt arrives POV for eval of malaise, fever, sore throat, not eating d/t sore throat. Pt reports "everyone else around her has the flu".

## 2021-03-20 ENCOUNTER — Other Ambulatory Visit: Payer: Self-pay

## 2021-03-20 ENCOUNTER — Encounter: Payer: Self-pay | Admitting: Emergency Medicine

## 2021-03-20 ENCOUNTER — Ambulatory Visit
Admission: EM | Admit: 2021-03-20 | Discharge: 2021-03-20 | Disposition: A | Discharge status: Home / Self Care | Payer: Medicaid Other

## 2021-03-20 DIAGNOSIS — W19XXXA Unspecified fall, initial encounter: Secondary | ICD-10-CM

## 2021-03-20 DIAGNOSIS — M542 Cervicalgia: Secondary | ICD-10-CM

## 2021-03-20 NOTE — ED Provider Notes (Signed)
?[facility] ? ? ? ?CSN: [id number] ?Arrival date & time: 03/20/21  1526 ? ? ?  ? ?History   ?Chief Complaint ?Chief Complaint  ?Patient presents with  ? Fall  ? ? ?HPI ?Shannon Bruce is a 21 y.o. female.  ? ?Patient presents due to neck pain that started yesterday after she fell out of her bed.  Patient reports that she fell off her bed landing on her head with all of her weight on her head and neck.  Patient is now having neck pain.  Patient has taken ibuprofen for pain with minimal improvement.  Denies headache, dizziness, blurred vision, nausea, vomiting. ? ? ?Fall ? ? ?Past Medical History:  ?Diagnosis Date  ? ADHD (attention deficit hyperactivity disorder)   ? ? ?Patient Active Problem List  ? Diagnosis Date Noted  ? MDD (major depressive disorder), recurrent, severe, with psychosis (HCC) 01/16/2014  ? Attention deficit hyperactivity disorder (ADHD), combined type, moderate 01/16/2014  ? ODD (oppositional defiant disorder) 01/16/2014  ? ? ?History reviewed. No pertinent surgical history. ? ?OB History   ?No obstetric history on file. ?  ? ? ? ?Home Medications   ? ?Prior to Admission medications   ?Not on File  ? ? ?Family History ?History reviewed. No pertinent family history. ? ?Social History ?Social History  ? ?Tobacco Use  ? Smoking status: Every Day  ?  Packs/day: 0.25  ?  Types: Cigarettes  ? Smokeless tobacco: Current  ?Substance Use Topics  ? Alcohol use: No  ? Drug use: Yes  ?  Types: Marijuana  ? ? ? ?Allergies   ?Patient has no known allergies. ? ? ?Review of Systems ?Review of Systems ?Per HPI ? ?Physical Exam ?Triage Vital Signs ?ED Triage Vitals  ?Enc Vitals Group  ?   BP 03/20/21 1546 128/73  ?   Pulse Rate 03/20/21 1546 74  ?   Resp 03/20/21 1546 18  ?   Temp 03/20/21 1546 98.3 ?F (36.8 ?C)  ?   Temp Source 03/20/21 1546 Oral  ?   SpO2 03/20/21 1546 98 %  ?   Weight 03/20/21 1547 196 lb (88.9 kg)  ?   Height 03/20/21 1547 5\' 4"  (1.626 m)  ?   Head Circumference --   ?   Peak Flow --    ?   Pain Score 03/20/21 1546 10  ?   Pain Loc --   ?   Pain Edu? --   ?   Excl. in GC? --   ? ?No data found. ? ?Updated Vital Signs ?BP 128/73 (BP Location: Left Arm)   Pulse 74   Temp 98.3 ?F (36.8 ?C) (Oral)   Resp 18   Ht 5\' 4"  (1.626 m)   Wt 196 lb (88.9 kg)   LMP 03/18/2021   SpO2 98%   BMI 33.64 kg/m?  ? ?Visual Acuity ?Right Eye Distance:   ?Left Eye Distance:   ?Bilateral Distance:   ? ?Right Eye Near:   ?Left Eye Near:    ?Bilateral Near:    ? ?Physical Exam ?Constitutional:   ?   General: She is not in acute distress. ?   Appearance: Normal appearance. She is not toxic-appearing or diaphoretic.  ?HENT:  ?   Head: Normocephalic and atraumatic.  ?Eyes:  ?   Extraocular Movements: Extraocular movements intact.  ?   Conjunctiva/sclera: Conjunctivae normal.  ?Pulmonary:  ?   Effort: Pulmonary effort is normal.  ?Neurological:  ?   General: No  focal deficit present.  ?   Mental Status: She is alert and oriented to person, place, and time. Mental status is at baseline.  ?   Cranial Nerves: Cranial nerves 2-12 are intact.  ?   Sensory: Sensation is intact.  ?   Motor: Motor function is intact.  ?   Coordination: Coordination is intact.  ?   Gait: Gait is intact.  ?Psychiatric:     ?   Mood and Affect: Mood normal.     ?   Behavior: Behavior normal.     ?   Thought Content: Thought content normal.     ?   Judgment: Judgment normal.  ? ? ? ?UC Treatments / Results  ?Labs ?(all labs ordered are listed, but only abnormal results are displayed) ?Labs Reviewed - No data to display ? ?EKG ? ? ?Radiology ?No results found. ? ?Procedures ?Procedures (including critical care time) ? ?Medications Ordered in UC ?Medications - No data to display ? ?Initial Impression / Assessment and Plan / UC Course  ?I have reviewed the triage vital signs and the nursing notes. ? ?Pertinent labs & imaging results that were available during my care of the patient were reviewed by me and considered in my medical decision making (see  chart for details). ? ?  ? ?Due to mechanism of injury, I do think patient needs CT imaging of head and/or neck.  Do not have ability to order CT in urgent care.  Patient was advised to go the ER for further evaluation and management.  Patient was agreeable with plan.  Vital signs and neuro exam stable at discharge.  Agree with patient self transport to the hospital. ?Final Clinical Impressions(s) / UC Diagnoses  ? ?Final diagnoses:  ?Fall, initial encounter  ?Neck pain  ? ? ? ?Discharge Instructions   ? ?  ?Please go to the emergency department as soon as you leave urgent care for further evaluation and management. ? ? ? ?ED Prescriptions   ?None ?  ? ?PDMP not reviewed this encounter. ?  ?Gustavus Bryant, [place] ?03/20/21 1557 ? ?

## 2021-03-20 NOTE — Discharge Instructions (Signed)
Please go to the emergency department as soon as you leave urgent care for further evaluation and management. ?

## 2021-03-20 NOTE — ED Triage Notes (Signed)
Patient states she fell off of her bed yesterday and all of her weight landed on her neck.  Patient has taken Ibuprofen for the pain. ?

## 2021-05-01 IMAGING — DX DG KNEE COMPLETE 4+V*L*
4 series · 4 of 4 positions shown · non-contrast
Comparison: 06/01/2017

CLINICAL DATA: Left knee injury, pain

EXAM:
LEFT KNEE - COMPLETE 4+ VIEW

[knee ap]
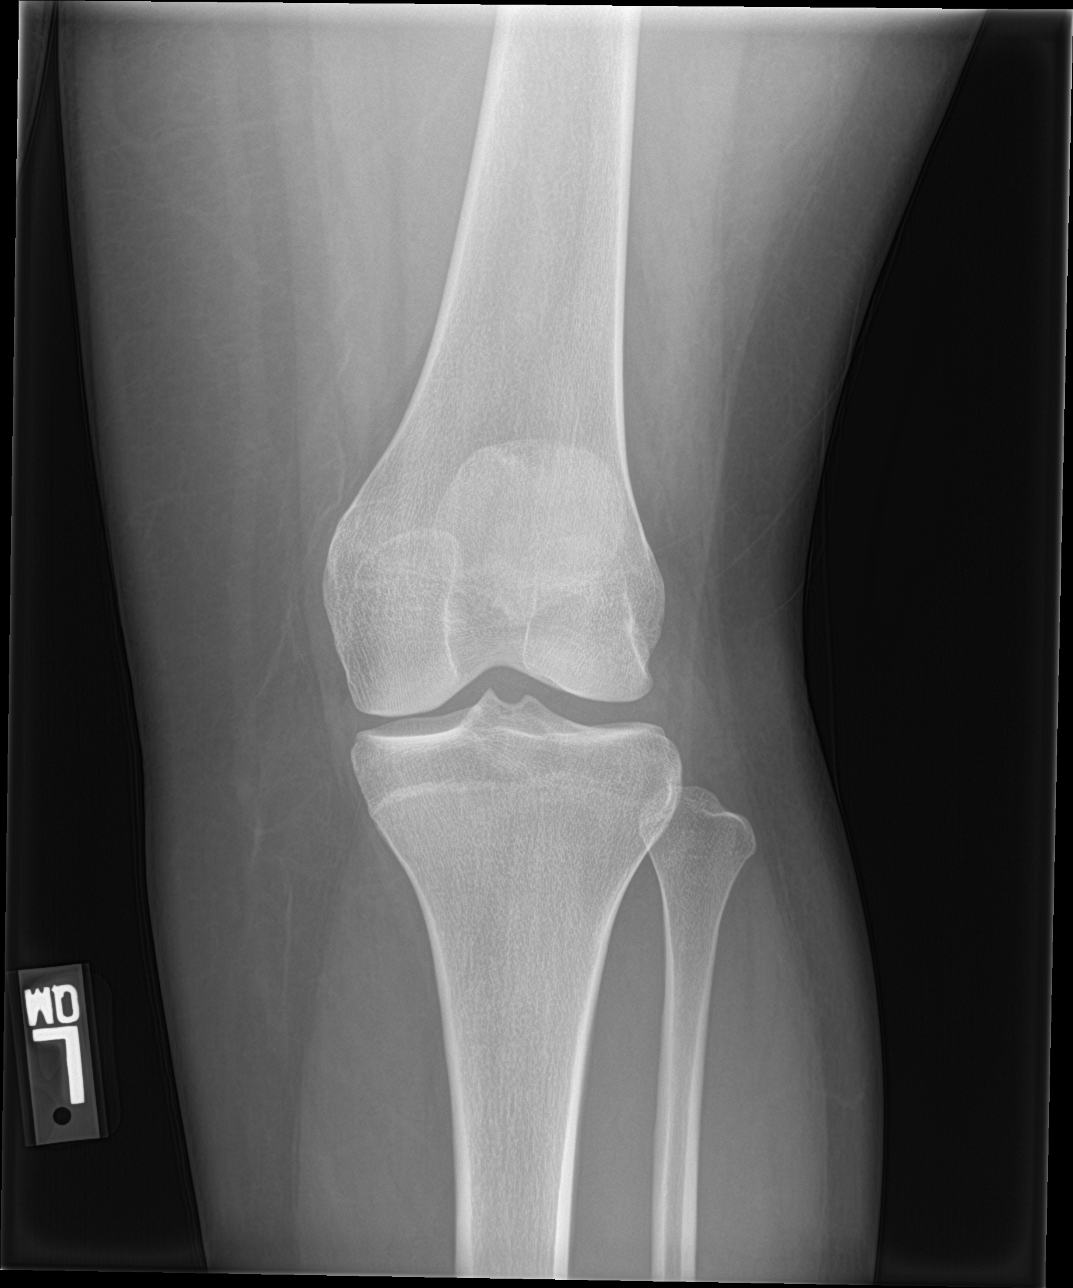

[knee lat]
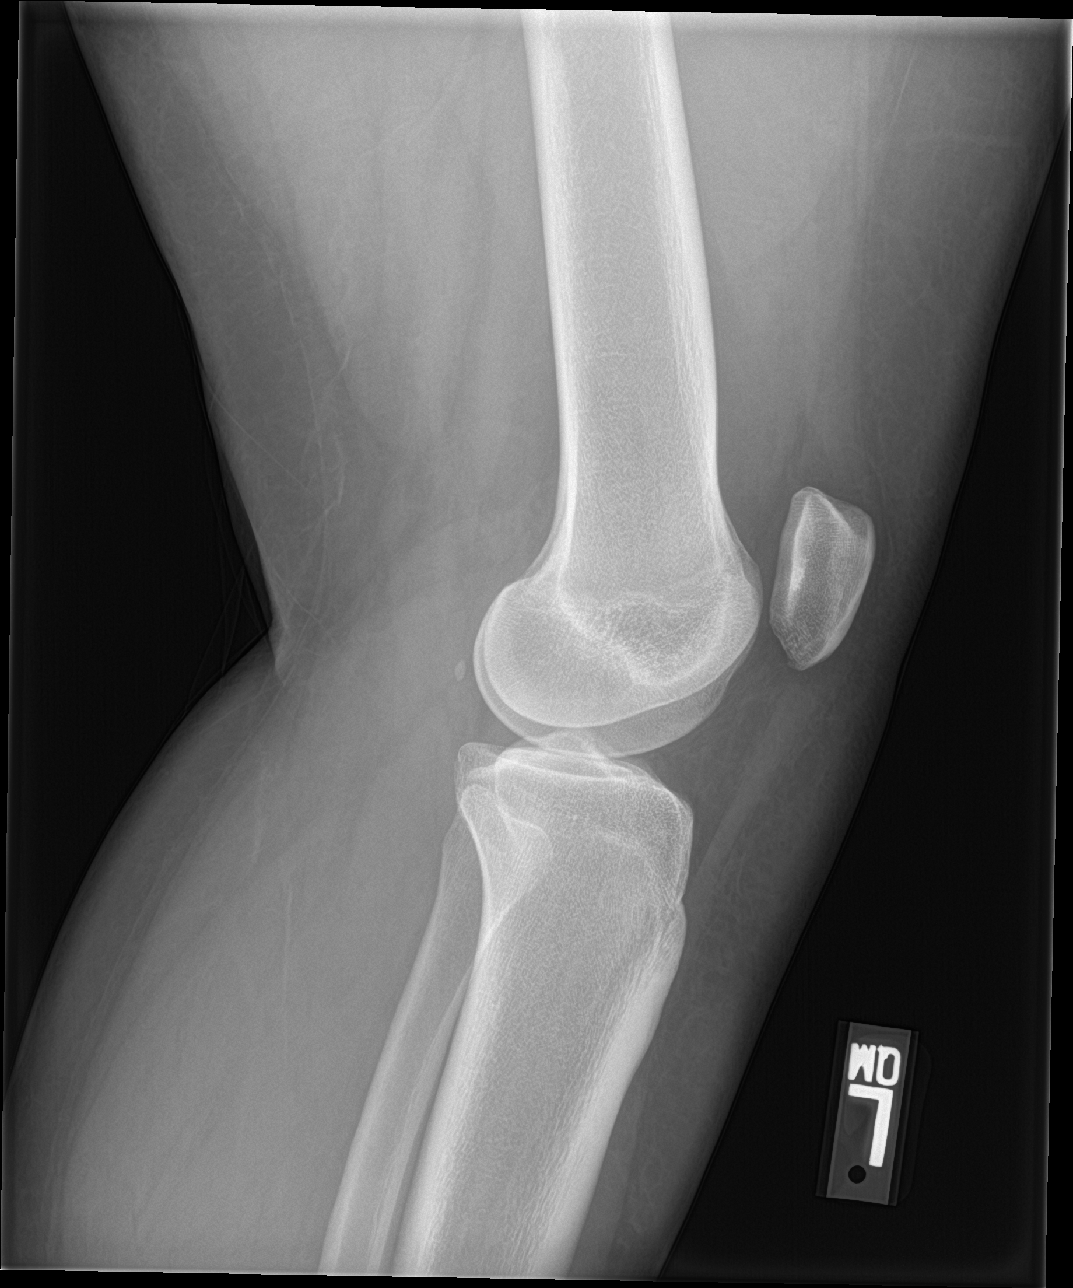

[knee obl (1 of 2)]
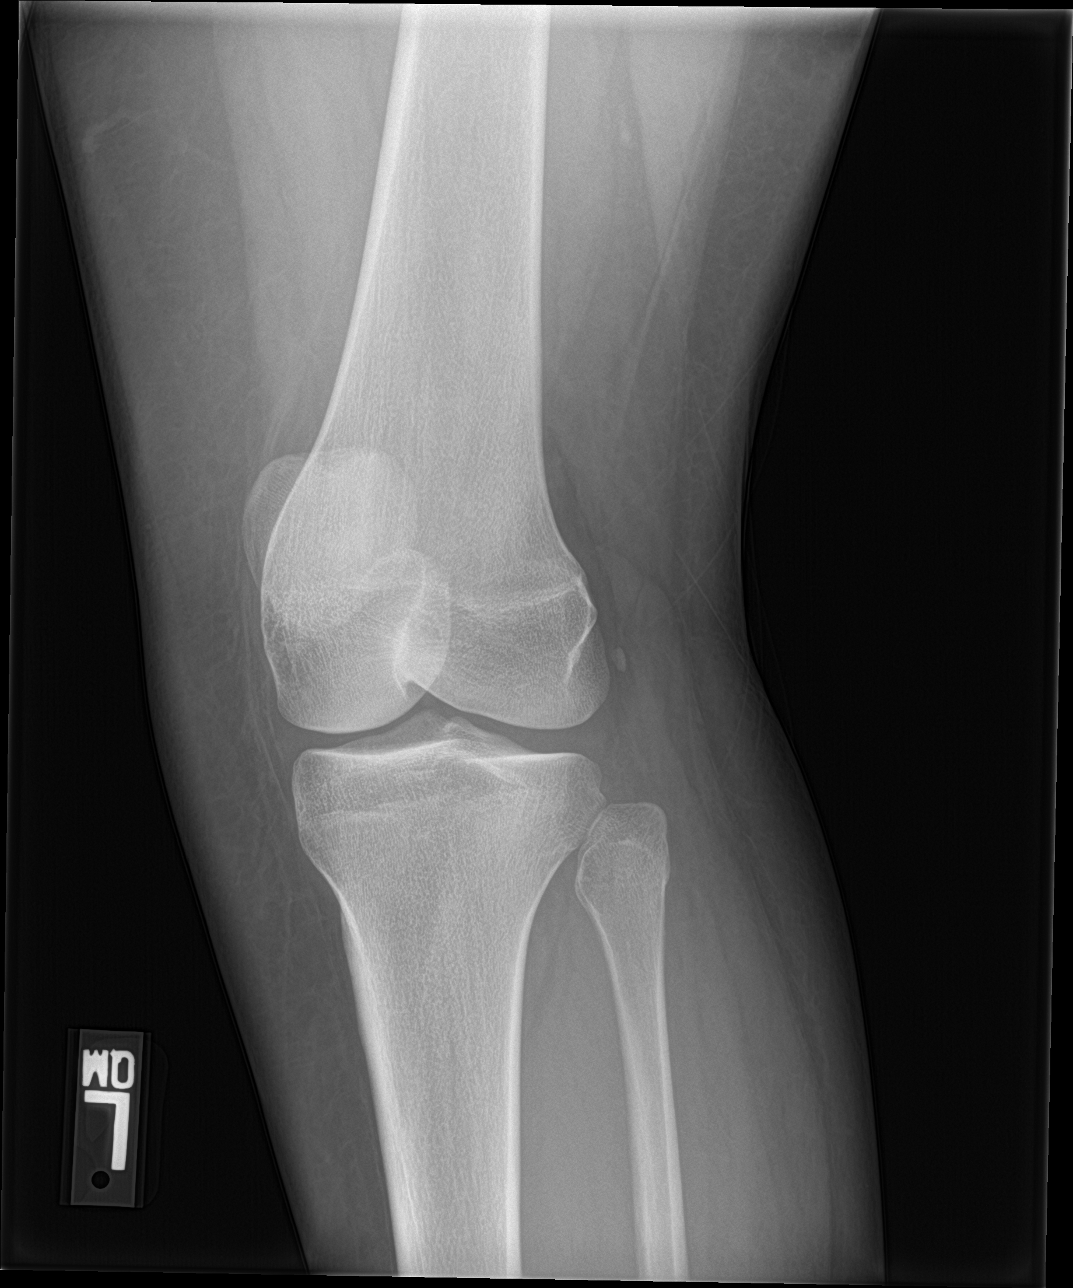

[knee obl (2 of 2)]
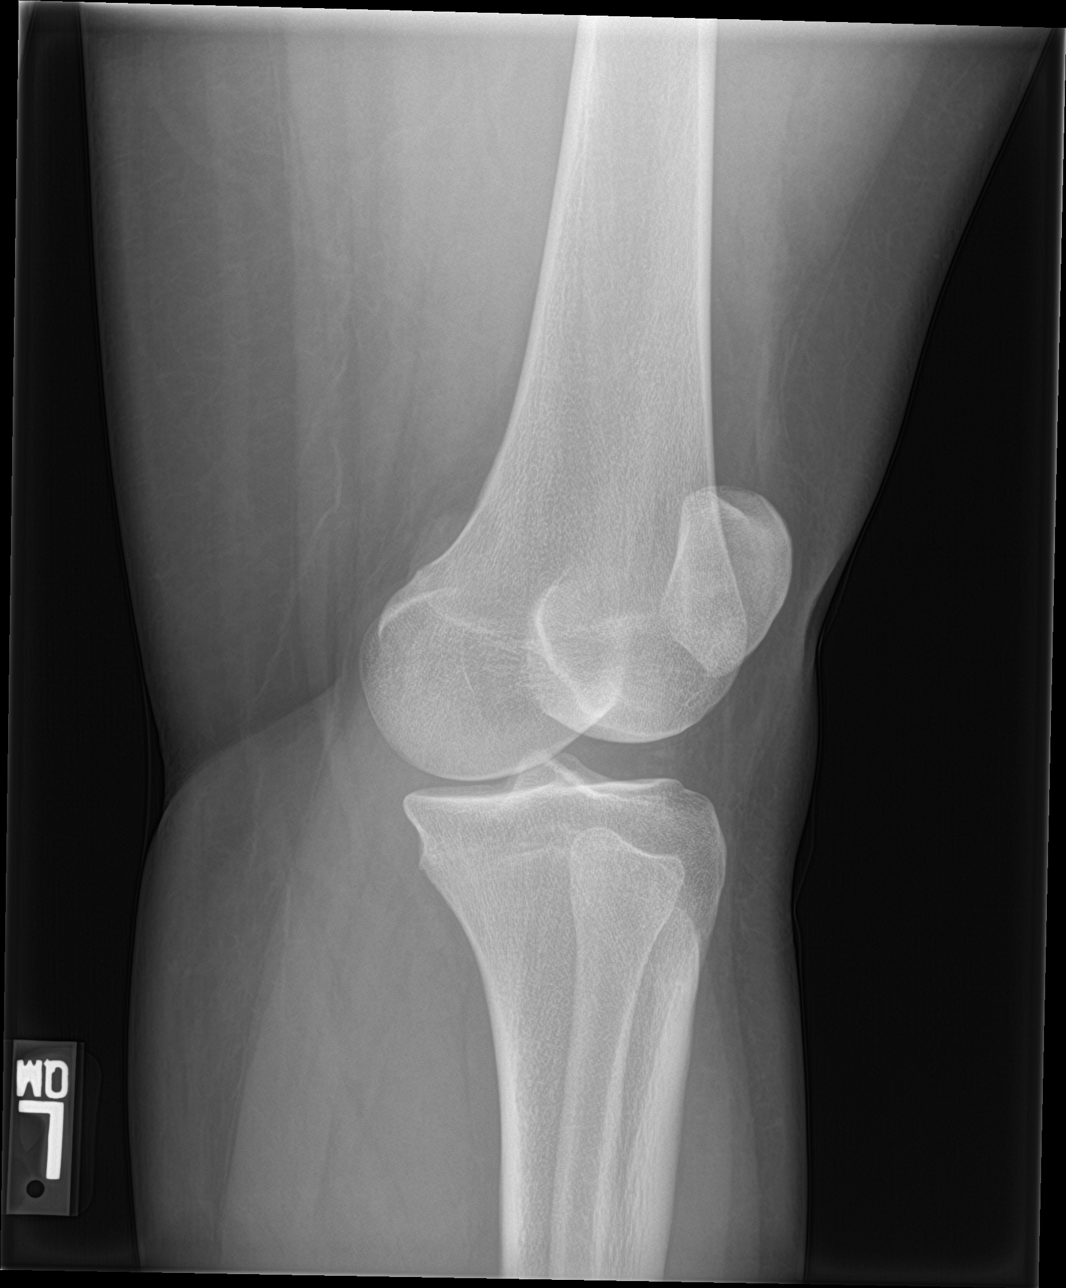

[4 of 4 positions shown; findings below may reference images not displayed]

FINDINGS: Frontal, bilateral oblique, lateral views of the left knee are
obtained. No fracture, subluxation, or dislocation. Joint spaces are
well preserved. No joint effusion. Soft tissues are unremarkable.
IMPRESSION: 1. Stable unremarkable left knee.

## 2021-07-26 ENCOUNTER — Ambulatory Visit (HOSPITAL_COMMUNITY)
Admission: EM | Admit: 2021-07-26 | Discharge: 2021-07-26 | Disposition: A | Discharge status: Home / Self Care | Payer: Medicaid Other | Attending: Registered Nurse | Admitting: Registered Nurse

## 2021-07-26 ENCOUNTER — Encounter (HOSPITAL_COMMUNITY): Payer: Self-pay | Admitting: Registered Nurse

## 2021-07-26 DIAGNOSIS — F4323 Adjustment disorder with mixed anxiety and depressed mood: Secondary | ICD-10-CM | POA: Diagnosis present
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
## 2021-07-26 NOTE — ED Provider Notes (Addendum)
Behavioral Health Urgent Care Medical Screening Exam  Patient Name: Shannon Bruce MRN: [id number] Date of Evaluation: 07/26/21 Chief Complaint:   Diagnosis:  Final diagnoses:  Adjustment disorder with mixed anxiety and depressed mood    History of Present illness: Shannon Bruce is a 21 y.o. female patient presented to [facility] as a walk in vial Patent examiner (The Behavioral Health Response Team (BHRT)) with complaints of depression and suicidal ideation  Shannon Bruce, 21 y.o., female patient seen face to face by this provider, consulted with Dr. Nelly Rout; and chart reviewed on 07/26/21.  On evaluation Shannon Bruce reports she called crisis line this morning outlay disagreement with her grandmother clear reports after the disagreement her grandmother started throwing her clothes out of the closet. She states she has been going through a lot lately and has had a lot of deaths in her family "My Mom, stepmom, and other relatives).  Patient also states she has been smoking "weed" this morning.  Patient states that her grandmother doesn't like her and doesn't like her being in her home. She states her grandmother doesn't respect her stuff or her.  "I've Tell her if you respect me, I'll respect you. That she does the just to piss me off and cause a reaction. Then when I blow up, I'm the bad guy. I told her you respect me I'll respect you, but she wants to treat me like the black sheep of the family and then she goes to her oldest grandchild and talk about me saying Shannon Bruce is doing this and Shannon Bruce is doing that and then my name is in everybody's mouth.  This has been going on for 7 years."  Patient states that she is unemployed but has been looking for work.  States that she and her 14 yr. sister is living with her grandmother.  States she has tried living with other relatives, but it is always the same and that everyone treats her the same.  States she has a history of depression and anxiety.  Reports that  she has been on psychotropic medications in the past but none since 2017 "I stopped taking them cause they made me feel zombified and I didn't feel like myself."   States she is interested in outpatient psychiatric services medication management only "I don't like therapy.  They just sit there and get paid to listen to you, they don't care about you.  I tried it before, and I don't like therapy.  It's just a waste of time."  Patient also reports a history of suicide attempt x 4 last time 2019, self-harm (cutting) but hasn't done it in a while, and psychiatric hospitalization.  At this time patient denies suicidal/self-harm/homicidal ideation.  Safety Bruce discussed.  Patient states that she can go stay with her friend Shannon Bruce.  Gave permission to speak to [facility] for collateral information and to discuss safety Bruce.   During evaluation Shannon Bruce is sitting in chair with no noted distress.  She is alert/oriented x 4; calm/cooperative; and mood congruent with affect.  She is speaking in a clear tone at moderate volume, and normal pace; with good eye contact.  Her thought process is coherent and relevant; There is no indication that she is currently responding to internal/external stimuli or experiencing delusional thought content.  She denies suicidal/self-harm/homicidal ideation, psychosis, and paranoia.  Safety Bruce discussed:  Patient will reach out to her friend Shannon Bruce, call mobile crisis, call 911, or go to the nearest emergency room.  Collateral Information:  Spoke to Shannon Bruce at [phone].  Shannon Bruce states that she is aware of the situation and that she went to see patient this morning at her grandmother's house.  States that patient knows that she is able to come stay with her at any time.  States that she doesn't feel that patient is a danger to herself or anyone else and should be safe to go home.  States that patient can come to her home and stay with her and that she will be there with  her.  Discussed if depression or symptoms worsen to call 911 or go to the nearest emergency room.  Understanding voiced.     Psychiatric Specialty Exam  Presentation  General Appearance:Appropriate for Environment; Casual  Eye Contact:Good  Speech:Clear and Coherent; Normal Rate  Speech Volume:Normal  Handedness:Right   Mood and Affect  Mood:Dysphoric  Affect:Congruent   Thought Process  Thought Processes:Coherent; Goal Directed  Descriptions of Associations:Intact  Orientation:Full (Time, Place and Person)  Thought Content:Logical    Hallucinations:None  Ideas of Reference:None  Suicidal Thoughts:No (Denies suicidal thoughts at this time.  States when she was upset earlier today was having some passive thoughts and needed to talk to someone)  Homicidal Thoughts:No   Sensorium  Memory:Immediate Good; Recent Good; Remote Good  Judgment:Intact  Insight:Present   Executive Functions  Concentration:Good  Attention Span:Good  Recall:Good  Fund of Knowledge:Good  Language:Good   Psychomotor Activity  Psychomotor Activity:Normal   Assets  Assets:Communication Skills; Desire for Improvement; Housing; Physical Health; Social Support   Sleep  Sleep:Good  Number of hours: No data recorded  Nutritional Assessment (For OBS and FBC admissions only) Has the patient had a weight loss or gain of 10 pounds or more in the last 3 months?: No Has the patient had a decrease in food intake/or appetite?: No Does the patient have dental problems?: No Does the patient have eating habits or behaviors that may be indicators of an eating disorder including binging or inducing vomiting?: No Has the patient recently lost weight without trying?: 0 Has the patient been eating poorly because of a decreased appetite?: 0 Malnutrition Screening Tool Score: 0    Physical Exam: Physical Exam Vitals and nursing note reviewed. Exam conducted with a chaperone present.   Constitutional:      General: She is not in acute distress.    Appearance: Normal appearance. She is not ill-appearing.  Cardiovascular:     Rate and Rhythm: Normal rate.  Pulmonary:     Effort: Pulmonary effort is normal.  Musculoskeletal:        General: Normal range of motion.     Cervical back: Normal range of motion.  Skin:    General: Skin is warm and dry.  Neurological:     Mental Status: She is alert and oriented to person, place, and time.  Psychiatric:        Attention and Perception: Attention and perception normal. She does not perceive auditory or visual hallucinations.        Mood and Affect: Mood is depressed.        Speech: Speech normal.        Behavior: Behavior normal. Behavior is cooperative.        Thought Content: Thought content normal. Thought content is not paranoid or delusional. Thought content does not include homicidal or suicidal ideation.        Cognition and Memory: Cognition normal.        Judgment: Judgment  normal.    Review of Systems  Constitutional: Negative.   HENT: Negative.    Eyes: Negative.   Respiratory: Negative.    Cardiovascular: Negative.   Gastrointestinal: Negative.   Genitourinary: Negative.   Musculoskeletal: Negative.   Skin: Negative.   Neurological: Negative.   Endo/Heme/Allergies: Negative.   Psychiatric/Behavioral:  Negative for hallucinations. Depression: Stable. Substance abuse: THC. Suicidal ideas: Denies at this time.The patient does not have insomnia. Nervous/anxious: Stable.       States she called crisis line after getting up set after a verbal altercation with her grandmother this morning and was having passive suicidal thoughts and needed to talk to someone.  Denies at this time stating she has had a chance to calm down   Blood pressure 134/88, pulse 77, temperature 98.2 F (36.8 C), temperature source Oral, resp. rate 18, SpO2 100 %. There is no height or weight on file to calculate  BMI.  Musculoskeletal: Strength & Muscle Tone: within normal limits Gait & Station: normal Patient leans: N/A   [facility] MSE Discharge Disposition for Follow up and Recommendations: Based on my evaluation the patient does not appear to have an emergency medical condition and can be discharged with resources and follow up care in outpatient services for Medication Management and Individual Therapy    Discharge Instructions       Outpatient psychiatric Services  Walk in hours for medication management Monday, Wednesday, Thursday, and Friday from 8:00 AM to 11:00 AM Recommend arriving by by 7:30 AM.  It is first come first serve.    Walk in hours for therapy intake Monday and Wednesday only 8:00 AM to 11:00 AM Encouraged to arrive by 7:30 AM.  It is first come first serve   Inpatient patient psychiatric services The Facility Based Crisis Unit offers comprehensive behavioral heath care services for mental health and substance abuse treatment.  Social work can also assist with referral to or getting you into a rehabilitation program short or long term      [facility] Address: [address] [place], [place], [place] [address] Phone: [phone]  Supported Employment The supported employment program is a person-centered, individualized, evidence-based support service that helps members choose, acquire, and maintain competitive employment in our community. This service supports the varying needs of individuals and promotes community inclusion and employment success. Members enrolled in the supported employment program can expect the following:  Development of an individual career Bruce Community based job placement Job shadowing Job development On-site job Furniture conservator/restorer and support  Supported Education Supported education helps our members receive the education and training they need to achieve their learning and recovery goals. This will assist members with becoming  gainfully employed in the job or career of their choice. The program includes assistance with: Registering for disability accommodations Enrolling in school and registering for classes Learning communication skills Scheduling tutoring sessions within your school [facility] partners with Vocational Rehabilitation to help increase the success of clients seeking employment and educational goals.  Want to learn more about our programs?   Please contact our intake department INTAKE: [phone] Ext [phone]  Mailing: [address] [address]   [place], [place] [address]   [email or web address]     Safety Bruce Shannon Bruce will reach out to Shannon Bruce, call 911 or call mobile crisis, or go to nearest emergency room if condition worsens or if suicidal thoughts become active Patients' will follow up with resources for outpatient psychiatric services (therapy/medication management), and [facility] for assistance  with job training and finding employment.  The suicide prevention education provided includes the following: Suicide risk factors Suicide prevention and interventions National Suicide Hotline telephone number [facility] assessment telephone number [facility] Emergency Assistance [address] and/or Residential Mobile Crisis Unit telephone number Request made of:  Shannon Bruce and Shannon Bruce Remove weapons (e.g., guns, rifles, knives), all items previously/currently identified as safety concern.   Remove drugs/medications (over the counter, prescriptions, illicit drugs), all items previously/currently identified as a safety concern.     Mobile Crisis Response Teams Listed by counties in vicinity of [facility] providers [facility] [phone] [facility] [phone] [facility] [phone] [facility] [phone] [place]                * [facility] [phone]                * [employer] [phone]  [facility] [phone] [facility]  [phone] * [employer] [phone]        Assunta Found, NP 07/26/2021, 4:08 PM

## 2021-07-26 NOTE — Discharge Instructions (Addendum)
  Outpatient psychiatric Services  Walk in hours for medication management Monday, Wednesday, Thursday, and Friday from 8:00 AM to 11:00 AM Recommend arriving by by 7:30 AM.  It is first come first serve.    Walk in hours for therapy intake Monday and Wednesday only 8:00 AM to 11:00 AM Encouraged to arrive by 7:30 AM.  It is first come first serve   Inpatient patient psychiatric services The Facility Based Crisis Unit offers comprehensive behavioral heath care services for mental health and substance abuse treatment.  Social work can also assist with referral to or getting you into a rehabilitation program short or long term      [facility] Address: [address] [place], [place], [place] [address] Phone: [phone]  Supported Employment The supported employment program is a person-centered, individualized, evidence-based support service that helps members choose, acquire, and maintain competitive employment in our community. This service supports the varying needs of individuals and promotes community inclusion and employment success. Members enrolled in the supported employment program can expect the following:  Development of an individual career plan Community based job placement Job shadowing Job development On-site job Furniture conservator/restorer and support  Supported Education Supported education helps our members receive the education and training they need to achieve their learning and recovery goals. This will assist members with becoming gainfully employed in the job or career of their choice. The program includes assistance with: Registering for disability accommodations Enrolling in school and registering for classes Learning communication skills Scheduling tutoring sessions within your school [facility] partners with V[employer] Rehabilitation to help increase the success of clients seeking employment and educational goals.  Want to learn more about our  programs?   Please contact our intake department INTAKE: [phone] Ext [phone]  Mailing: [address] [address]   [place], [place] [address]   [email or web address]     Safety Plan Krysti Hickling will reach out to Sandria Bales, call 911 or call mobile crisis, or go to nearest emergency room if condition worsens or if suicidal thoughts become active Patients' will follow up with resources for outpatient psychiatric services (therapy/medication management), and [facility] for assistance with job training and finding employment.  The suicide prevention education provided includes the following: Suicide risk factors Suicide prevention and interventions National Suicide Hotline telephone number [facility] assessment telephone number [facility] Emergency Assistance [address] and/or Residential Mobile Crisis Unit telephone number Request made of:  Sandria Bales and Marvel Plan Remove weapons (e.g., guns, rifles, knives), all items previously/currently identified as safety concern.   Remove drugs/medications (over the counter, prescriptions, illicit drugs), all items previously/currently identified as a safety concern.     Mobile Crisis Response Teams Listed by counties in vicinity of [facility] providers [facility] [phone] [facility] [phone] [facility] [phone] [facility] [phone] [place]                * [facility] [phone]                * [employer] [phone]  [facility] [phone] [facility] [employer]  [phone] * [employer] [phone]
